# Patient Record
Sex: Male | Born: 1995 | Race: Black or African American | Hispanic: No | Marital: Single | Smoking: Current some day smoker
Health system: Southern US, Community
[De-identification: ages and names within clinical notes are randomized; demographics above are authoritative.]

---
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
## 2018-10-09 ENCOUNTER — Emergency Department (HOSPITAL_COMMUNITY)
Admission: EM | Admit: 2018-10-09 | Discharge: 2018-10-09 | Disposition: A | Discharge status: Home / Self Care | Payer: Self-pay | Attending: Emergency Medicine | Admitting: Emergency Medicine

## 2018-10-09 ENCOUNTER — Encounter (HOSPITAL_COMMUNITY): Payer: Self-pay | Admitting: Emergency Medicine

## 2018-10-09 ENCOUNTER — Emergency Department (HOSPITAL_COMMUNITY): Payer: Self-pay

## 2018-10-09 ENCOUNTER — Other Ambulatory Visit: Payer: Self-pay

## 2018-10-09 DIAGNOSIS — Y929 Unspecified place or not applicable: Secondary | ICD-10-CM | POA: Insufficient documentation

## 2018-10-09 DIAGNOSIS — W2209XA Striking against other stationary object, initial encounter: Secondary | ICD-10-CM | POA: Insufficient documentation

## 2018-10-09 DIAGNOSIS — Y9389 Activity, other specified: Secondary | ICD-10-CM | POA: Insufficient documentation

## 2018-10-09 DIAGNOSIS — Y999 Unspecified external cause status: Secondary | ICD-10-CM | POA: Insufficient documentation

## 2018-10-09 DIAGNOSIS — S6000XA Contusion of unspecified finger without damage to nail, initial encounter: Secondary | ICD-10-CM | POA: Insufficient documentation

## 2018-10-09 NOTE — ED Triage Notes (Signed)
Pt reports punching a table last night. Pt c/o RT hand pain especially towards 3rd and 4th finger. Edema and possible deformity noted.

## 2018-10-09 NOTE — Discharge Instructions (Addendum)
X-ray shows no broken bones.  You will be sore for several days.  Ice.  Elevate.

## 2018-10-09 NOTE — ED Provider Notes (Signed)
[facility] EMERGENCY DEPARTMENT Provider Note   CSN: [id number] Arrival date & time: 10/09/18  0957    History   Chief Complaint Chief Complaint  Patient presents with  . Hand Injury    HPI Nevil Anger is a 23 y.o. male.     Right hand pain after smashing a table last night.  No other injuries.  Severity is moderate.     History reviewed. No pertinent past medical history.  There are no active problems to display for this patient.   History reviewed. No pertinent surgical history.      Home Medications    Prior to Admission medications   Medication Sig Start Date End Date Taking? Authorizing Provider  amoxicillin (AMOXIL) 500 MG capsule Take 1 capsule (500 mg total) by mouth 3 (three) times daily. 10/22/12   Ivery Quale, PA-C  HYDROcodone-acetaminophen (NORCO/VICODIN) 5-325 MG per tablet Take 1 tablet by mouth every 4 (four) hours as needed for pain. 10/22/12   Ivery Quale, PA-C    Family History No family history on file.  Social History Social History   Tobacco Use  . Smoking status: Current Every Day Smoker    Packs/day: 0.50  . Smokeless tobacco: Never Used  Substance Use Topics  . Alcohol use: Yes    Comment: occassional  . Drug use: Not Currently     Allergies   Patient has no known allergies.   Review of Systems Review of Systems  All other systems reviewed and are negative.    Physical Exam Updated Vital Signs BP 139/85   Pulse (!) 52   Temp 98.3 F (36.8 C) (Oral)   Resp 14   Wt 68 kg   SpO2 100%   Physical Exam Vitals signs and nursing note reviewed.  Constitutional:      Appearance: He is well-developed.  HENT:     Head: Normocephalic and atraumatic.  Eyes:     Conjunctiva/sclera: Conjunctivae normal.  Neck:     Musculoskeletal: Neck supple.  Musculoskeletal:     Comments: Right hand: Most tender over the fourth and fifth metacarpal joint.  Pain with flexion.  Minimal swelling.  Skin:    General: Skin is  warm and dry.  Neurological:     Mental Status: He is alert and oriented to person, place, and time.  Psychiatric:        Behavior: Behavior normal.      ED Treatments / Results  Labs (all labs ordered are listed, but only abnormal results are displayed) Labs Reviewed - No data to display  EKG None  Radiology Dg Hand Complete Right  Result Date: 10/09/2018 CLINICAL DATA:  Punching injury last night with fourth and fifth finger pain. EXAM: RIGHT HAND - COMPLETE 3+ VIEW COMPARISON:  None. FINDINGS: There is no evidence of fracture or dislocation. There is no evidence of arthropathy or other focal bone abnormality. Soft tissues are unremarkable. IMPRESSION: Negative. Electronically Signed   By: Paulina Fusi M.D.   On: 10/09/2018 10:48    Procedures Procedures (including critical care time)  Medications Ordered in ED Medications - No data to display   Initial Impression / Assessment and Plan / ED Course  I have reviewed the triage vital signs and the nursing notes.  Pertinent labs & imaging results that were available during my care of the patient were reviewed by me and considered in my medical decision making (see chart for details).        Plain films of right hand  negative.  R ICE.  Final Clinical Impressions(s) / ED Diagnoses   Final diagnoses:  Contusion of finger of right hand, unspecified finger, initial encounter    ED Discharge Orders    None       Donnetta Hutching, MD 10/09/18 1353

## 2018-10-28 ENCOUNTER — Encounter (HOSPITAL_COMMUNITY): Payer: Self-pay

## 2018-10-28 ENCOUNTER — Emergency Department (HOSPITAL_COMMUNITY)
Admission: EM | Admit: 2018-10-28 | Discharge: 2018-10-28 | Disposition: A | Discharge status: Home / Self Care | Payer: BLUE CROSS/BLUE SHIELD | Attending: Emergency Medicine | Admitting: Emergency Medicine

## 2018-10-28 ENCOUNTER — Emergency Department (HOSPITAL_COMMUNITY): Payer: BLUE CROSS/BLUE SHIELD

## 2018-10-28 ENCOUNTER — Other Ambulatory Visit: Payer: Self-pay

## 2018-10-28 DIAGNOSIS — Y9389 Activity, other specified: Secondary | ICD-10-CM | POA: Insufficient documentation

## 2018-10-28 DIAGNOSIS — S9031XA Contusion of right foot, initial encounter: Secondary | ICD-10-CM | POA: Diagnosis not present

## 2018-10-28 DIAGNOSIS — S99921A Unspecified injury of right foot, initial encounter: Secondary | ICD-10-CM | POA: Diagnosis present

## 2018-10-28 DIAGNOSIS — W208XXA Other cause of strike by thrown, projected or falling object, initial encounter: Secondary | ICD-10-CM | POA: Insufficient documentation

## 2018-10-28 DIAGNOSIS — Y92019 Unspecified place in single-family (private) house as the place of occurrence of the external cause: Secondary | ICD-10-CM | POA: Insufficient documentation

## 2018-10-28 DIAGNOSIS — F17210 Nicotine dependence, cigarettes, uncomplicated: Secondary | ICD-10-CM | POA: Insufficient documentation

## 2018-10-28 DIAGNOSIS — Y998 Other external cause status: Secondary | ICD-10-CM | POA: Diagnosis not present

## 2018-10-28 MED ORDER — IBUPROFEN 600 MG PO TABS: 600.0000 mg | ORAL_TABLET | Freq: Four times a day (QID) | ORAL | 0 refills | Status: DC | PRN

## 2018-10-28 MED ORDER — IBUPROFEN 800 MG PO TABS
800.0000 mg | ORAL_TABLET | Freq: Once | ORAL | Status: AC
  Administered 2018-10-28: 800 mg via ORAL
  Filled 2018-10-28: qty 1

## 2018-10-28 NOTE — Discharge Instructions (Addendum)
Your xrays are negative, no broken bones.  Recommend ice and elevation as much as possible over the next several days to help with swelling and pain.  You may continue to wrap your foot which will also help with swelling.  Use the crutches for comfort.  Plan a recheck by Dr. Romeo Apple if your symptoms are not improving over the next 2 weeks.  Expect that your foot will develop significant bruising as you are injury continues to heal.  You have been prescribed ibuprofen to help with pain and swelling.  You can pick this prescription up at your pharmacy.

## 2018-10-28 NOTE — ED Provider Notes (Signed)
[facility] EMERGENCY DEPARTMENT Provider Note   CSN: [id number] Arrival date & time: 10/28/18  1942    History   Chief Complaint Chief Complaint  Patient presents with  . Foot Injury    right, dorsal    HPI Ryan Velez is a 23 y.o. male.     The history is provided by the patient.  Foot Injury  Location:  Foot Foot location:  R foot Pain details:    Quality:  Throbbing and aching   Radiates to:  Does not radiate   Severity:  Moderate   Duration: Injury occured several hours ago.  He was rearranging furniture when a TV fell on the top of his right foot.     Progression:  Worsening (swelling worsening over the past hour) Dislocation: no   Foreign body present:  No foreign bodies Prior injury to area:  No Relieved by:  None tried Worsened by:  Bearing weight Ineffective treatments:  None tried Associated symptoms: decreased ROM and swelling   Associated symptoms: no fever and no tingling     History reviewed. No pertinent past medical history.  There are no active problems to display for this patient.   History reviewed. No pertinent surgical history.      Home Medications    Prior to Admission medications   Medication Sig Start Date End Date Taking? Authorizing Provider  ibuprofen (ADVIL,MOTRIN) 600 MG tablet Take 1 tablet (600 mg total) by mouth every 6 (six) hours as needed. 10/28/18   Burgess Amor, PA-C    Family History No family history on file.  Social History Social History   Tobacco Use  . Smoking status: Current Every Day Smoker    Packs/day: 0.50  . Smokeless tobacco: Never Used  Substance Use Topics  . Alcohol use: Yes    Comment: occassional  . Drug use: Not on file     Allergies   Patient has no known allergies.   Review of Systems Review of Systems  Constitutional: Negative for fever.  Musculoskeletal: Positive for arthralgias and joint swelling. Negative for myalgias.  Neurological: Negative for weakness and numbness.      Physical Exam Updated Vital Signs BP 132/76 (BP Location: Left Arm)   Pulse 87   Temp 98.3 F (36.8 C) (Oral)   Resp 16   Ht 5\' 11"  (1.803 m)   Wt 68 kg   SpO2 98%   BMI 20.92 kg/m   Physical Exam Constitutional:      Appearance: He is well-developed.  HENT:     Head: Atraumatic.  Neck:     Musculoskeletal: Normal range of motion.  Cardiovascular:     Comments: Pulses equal bilaterally Musculoskeletal:        General: Tenderness present.     Right foot: Decreased range of motion. Swelling present. No deformity or laceration.     Comments: Moderate edema to dorsal right foot, not involving toes.  Ankle is nontender.  Dorsalis pedis pulse is appreciated.  He has less than 2-second cap refill in his toes.  He has difficulty flexing the ankle and bending the toe secondary to swelling.  Skin is intact.  Skin:    General: Skin is warm and dry.  Neurological:     Mental Status: He is alert.     Sensory: No sensory deficit.     Deep Tendon Reflexes: Reflexes normal.      ED Treatments / Results  Labs (all labs ordered are listed, but only abnormal results are  displayed) Labs Reviewed - No data to display  EKG None  Radiology Dg Foot Complete Right  Result Date: 10/28/2018 CLINICAL DATA:  Swelling along the dorsal aspect of the foot. Pulses present. Bruising and redness. Recent trauma with a TV falling on the foot. EXAM: RIGHT FOOT COMPLETE - 3+ VIEW COMPARISON:  None. FINDINGS: There is swelling along the top of the foot.  No fractures are seen. IMPRESSION: Soft tissue swelling.  No fracture identified. Electronically Signed   By: Gerome Sam III M.D   On: 10/28/2018 21:13    Procedures Procedures (including critical care time)  Medications Ordered in ED Medications  ibuprofen (ADVIL,MOTRIN) tablet 800 mg (800 mg Oral Given 10/28/18 2106)     Initial Impression / Assessment and Plan / ED Course  I have reviewed the triage vital signs and the nursing notes.   Pertinent labs & imaging results that were available during my care of the patient were reviewed by me and considered in my medical decision making (see chart for details).        Pt with foot contusion secondary to direct blow. No fractures noted,  nv intact.  Watson jones dressing, crutches for comfort.  RICE. Prn f/u with Dr Romeo Apple if sx are not improving over the next 10-14 days.  Final Clinical Impressions(s) / ED Diagnoses   Final diagnoses:  Contusion of right foot, initial encounter    ED Discharge Orders         Ordered    ibuprofen (ADVIL,MOTRIN) 600 MG tablet  Every 6 hours PRN     10/28/18 2119           Victoriano Lain 10/28/18 2120    Terrilee Files, MD 10/29/18 1055

## 2018-10-28 NOTE — ED Triage Notes (Signed)
Pt reports a "TV falling on top of right foot" while attempting to move furniture around. Dorsal aspect of right foot is swollen, pulses present. Bruising, redness present.

## 2018-11-11 ENCOUNTER — Telehealth: Payer: Self-pay | Admitting: Orthopedic Surgery

## 2018-11-11 NOTE — Telephone Encounter (Signed)
Patient was seen in [facility] ER on 10/28/18 for Contusion of right foot. He was told to call our office in two weeks to schedule an appointment with you. I explained how our office is scheduling now with the COVID-19 restrictions and he was ok with waiting for a respond from our office.  Please advise

## 2018-11-16 NOTE — Telephone Encounter (Signed)
Virtual visit next week 

## 2018-11-21 ENCOUNTER — Ambulatory Visit (INDEPENDENT_AMBULATORY_CARE_PROVIDER_SITE_OTHER): Payer: BLUE CROSS/BLUE SHIELD | Admitting: Orthopedic Surgery

## 2018-11-21 ENCOUNTER — Other Ambulatory Visit: Payer: Self-pay

## 2018-11-21 DIAGNOSIS — S9031XA Contusion of right foot, initial encounter: Secondary | ICD-10-CM | POA: Diagnosis not present

## 2018-11-21 NOTE — Patient Instructions (Signed)
Come into the office on Wednesday between 10 and 11 for the nurse to apply a cam walker

## 2018-11-21 NOTE — Progress Notes (Signed)
Virtual Visit via Telephone Note  I connected with Ryan Velez on 11/21/18 at  2:20 PM EDT by telephone and verified that I am speaking with the correct person using two identifiers.   I discussed the limitations, risks, security and privacy concerns of performing an evaluation and management service by telephone and the availability of in person appointments. I also discussed with the patient that there may be a patient responsible charge related to this service. The patient expressed understanding and agreed to proceed.   I discussed the assessment and treatment plan with the patient. The patient was provided an opportunity to ask questions and all were answered. The patient agreed with the plan and demonstrated an understanding of the instructions.   The patient was advised to call back or seek an in-person evaluation if the symptoms worsen or if the condition fails to improve as anticipated.  I provided 5 minutes of non-face-to-face time during this encounter.  Chief Complaint  Patient presents with  . Foot Pain    Pain right foot March 3   23 year old male dropped a television on his foot on April 3.  He went to the ER he was diagnosed with contusion placed in an Ace wrap given crutches and ibuprofen and presents for his follow-up visit virtually  Location of pain is on the dorsum of the right foot quality of pain is dull severity is moderate timing is constant duration since April 3.  The patient says that he can walk on it but cannot stand up for long periods of time he has a large hematoma on the dorsum of the foot  Review of Systems  Musculoskeletal: Positive for joint pain.  Skin:       Hematoma foot/right  Neurological: Negative for tingling.   No past medical history on file. Denies any history of hypertension diabetes heart disease  Physical Exam Virtual visit could not perform  Medical decision making  Data image interpretation: The radiograph shows that the x-ray  was taken of the injured right foot there is no fracture dislocation or arthritis.  This is my personal interpretation of the image  The image report was also reviewed  CLINICAL DATA:  Swelling along the dorsal aspect of the foot. Pulses present. Bruising and redness. Recent trauma with a TV falling on the foot.   EXAM: RIGHT FOOT COMPLETE - 3+ VIEW   COMPARISON:  None.   FINDINGS: There is swelling along the top of the foot.  No fractures are seen.   IMPRESSION: Soft tissue swelling.  No fracture identified.     Electronically Signed   By: Gerome Sam III M.D   On: 10/28/2018 21:13   Diagnosis new problem contusion right foot Encounter Diagnosis  Name Primary?  . Contusion of right foot, initial encounter Yes     Risk section of medical decision making: Over-the-counter medication ibuprofen is fine he can continue that.  I would like to place him in a cam walker and he will come to the office to get that on Wednesday     Fuller Canada, MD

## 2018-11-23 ENCOUNTER — Ambulatory Visit (INDEPENDENT_AMBULATORY_CARE_PROVIDER_SITE_OTHER): Payer: BLUE CROSS/BLUE SHIELD | Admitting: Orthopedic Surgery

## 2018-11-23 ENCOUNTER — Other Ambulatory Visit: Payer: Self-pay

## 2018-11-23 DIAGNOSIS — S9031XD Contusion of right foot, subsequent encounter: Secondary | ICD-10-CM

## 2018-11-23 NOTE — Progress Notes (Signed)
No charges, patient has gotten a cam walker  boot, Thanks

## 2018-12-14 ENCOUNTER — Encounter: Payer: Self-pay | Admitting: Orthopedic Surgery

## 2018-12-14 ENCOUNTER — Ambulatory Visit (INDEPENDENT_AMBULATORY_CARE_PROVIDER_SITE_OTHER): Payer: BLUE CROSS/BLUE SHIELD | Admitting: Orthopedic Surgery

## 2018-12-14 ENCOUNTER — Other Ambulatory Visit: Payer: Self-pay

## 2018-12-14 VITALS — BP 116/75 | HR 53 | Temp 97.2°F | Ht 71.0 in | Wt 155.0 lb

## 2018-12-14 DIAGNOSIS — S9031XD Contusion of right foot, subsequent encounter: Secondary | ICD-10-CM | POA: Diagnosis not present

## 2018-12-14 NOTE — Patient Instructions (Signed)
Resume normal activity with the boot off

## 2018-12-14 NOTE — Progress Notes (Signed)
Progress Note   Patient ID: Ryan Velez, male   DOB: [birth year], 23 y.o.   MRN: [id number]   Chief Complaint  Patient presents with  . Foot Pain    improving right foot injury April 3rd      22 MALE television fell on his right foot he was placed in a cam walker to improve his gait.  The foot is improved this not has gone down to where it was on the top of his foot    Review of Systems  Skin: Negative.   Neurological: Negative for tingling.     No Known Allergies   BP 116/75   Pulse (!) 53   Temp (!) 97.2 F (36.2 C)   Ht 5\' 11"  (1.803 m)   Wt 155 lb (70.3 kg)   BMI 21.62 kg/m   Physical Exam Vitals signs and nursing note reviewed.  Constitutional:      Appearance: Normal appearance.  Musculoskeletal:     Right foot: Normal range of motion and normal capillary refill. No tenderness, bony tenderness, swelling or deformity.  Neurological:     Mental Status: He is alert and oriented to person, place, and time.  Psychiatric:        Mood and Affect: Mood normal.      Medical decisions:   Data  Imaging:   The image showed no fracture  Encounter Diagnosis  Name Primary?  . Contusion of right foot, subsequent encounter Yes    PLAN:   Remove the boot, return to normal activity    Fuller Canada, MD 12/14/2018 9:29 AM

## 2019-06-03 IMAGING — CR RIGHT FOOT COMPLETE - 3+ VIEW
3 series · 3 of 3 positions shown · non-contrast
Comparison: None.

CLINICAL DATA: Swelling along the dorsal aspect of the foot. Pulses
present. Bruising and redness. Recent trauma with a TV falling on
the foot.

EXAM:
RIGHT FOOT COMPLETE - 3+ VIEW

[ap]
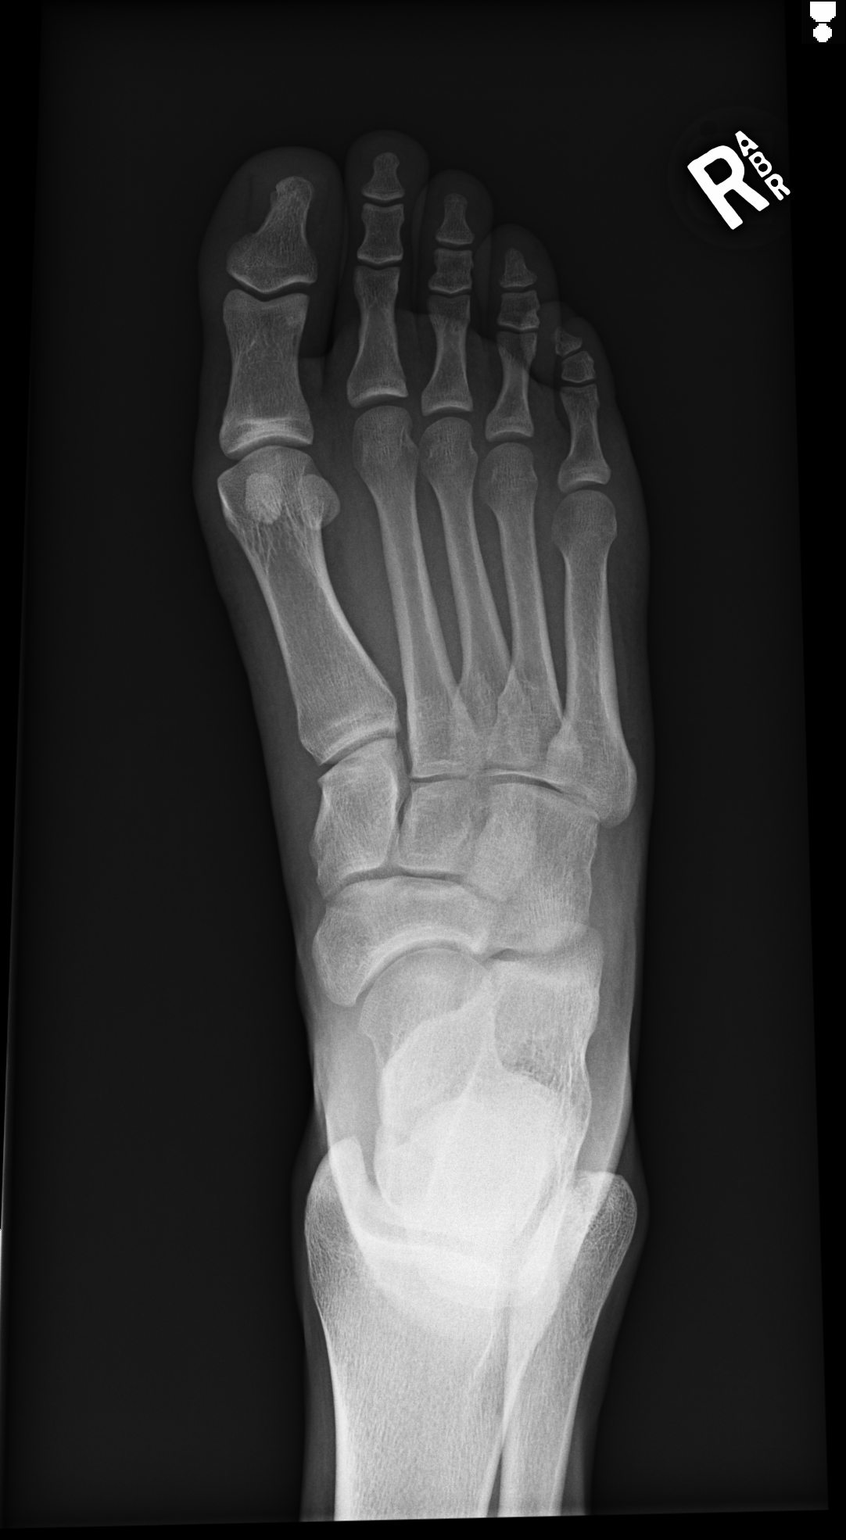

[oblique]
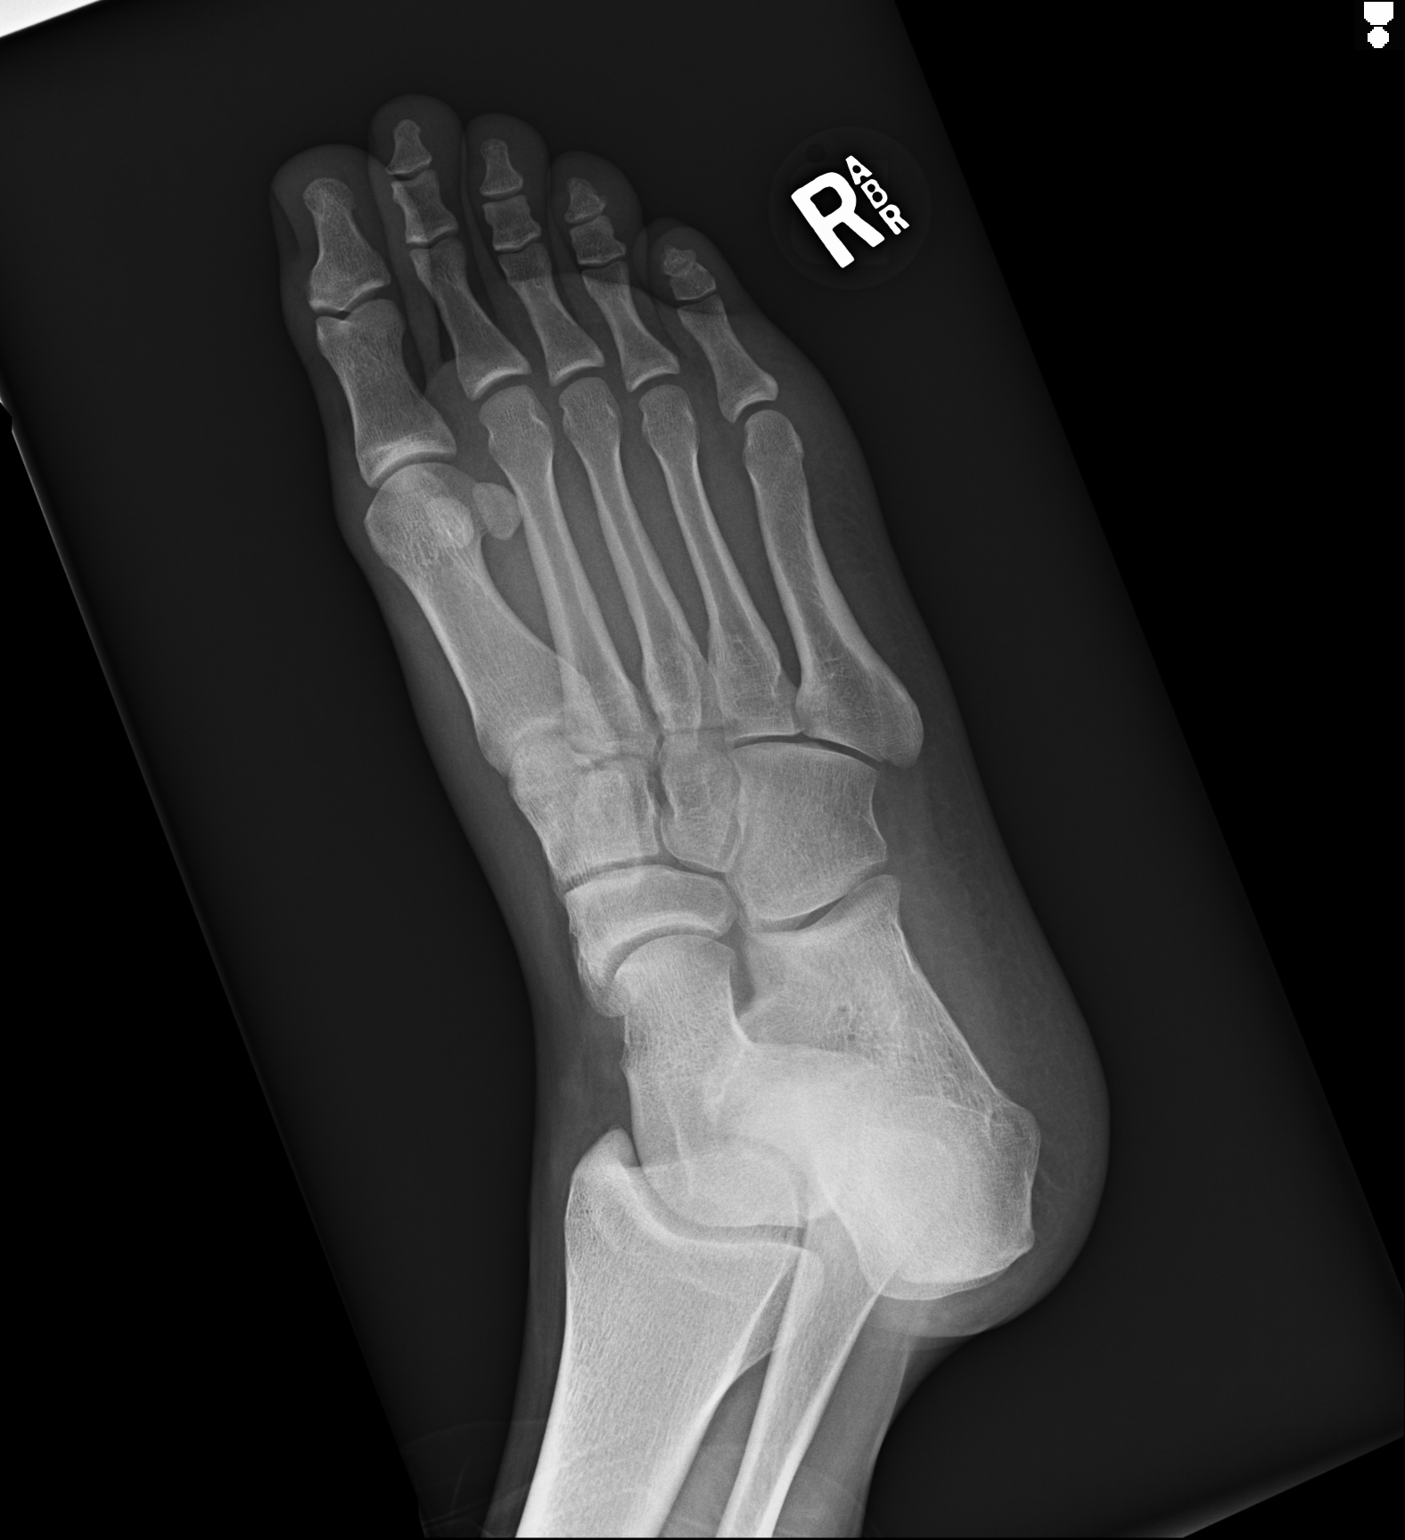

[lat]
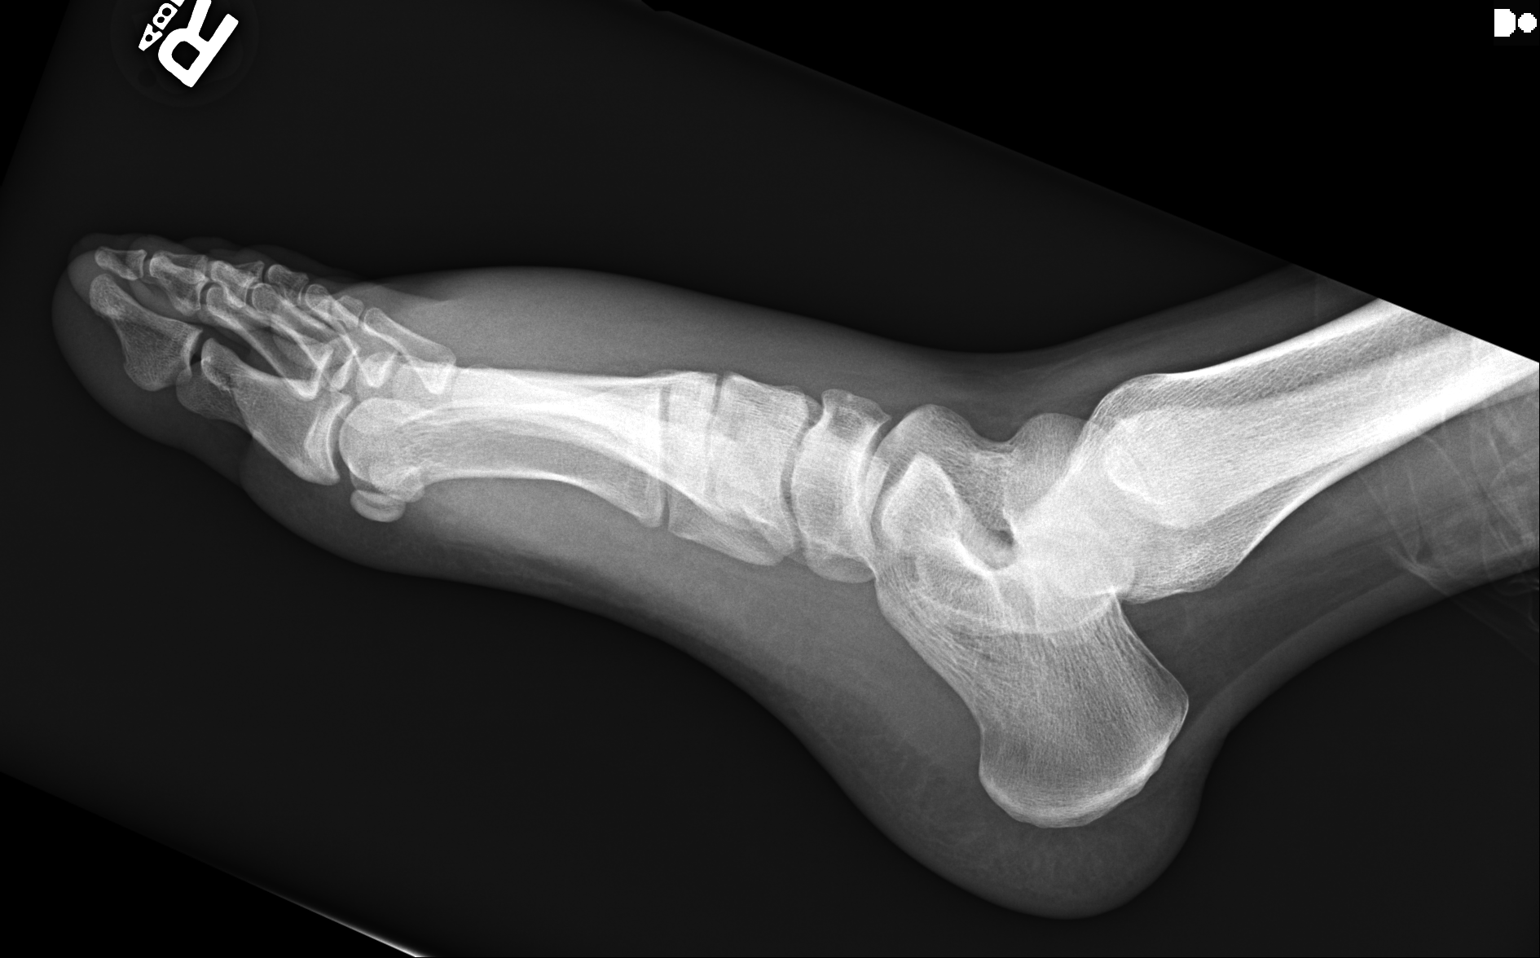

[3 of 3 positions shown; findings below may reference images not displayed]

FINDINGS: There is swelling along the top of the foot.  No fractures are seen.
IMPRESSION: Soft tissue swelling.  No fracture identified.

## 2020-12-03 ENCOUNTER — Encounter (HOSPITAL_COMMUNITY): Payer: Self-pay | Admitting: *Deleted

## 2020-12-03 ENCOUNTER — Other Ambulatory Visit: Payer: Self-pay

## 2020-12-03 ENCOUNTER — Emergency Department (HOSPITAL_COMMUNITY)
Admission: EM | Admit: 2020-12-03 | Discharge: 2020-12-03 | Disposition: A | Discharge status: Home / Self Care | Payer: Self-pay | Attending: Emergency Medicine | Admitting: Emergency Medicine

## 2020-12-03 ENCOUNTER — Emergency Department (HOSPITAL_COMMUNITY): Payer: Self-pay

## 2020-12-03 DIAGNOSIS — W228XXA Striking against or struck by other objects, initial encounter: Secondary | ICD-10-CM | POA: Insufficient documentation

## 2020-12-03 DIAGNOSIS — F17210 Nicotine dependence, cigarettes, uncomplicated: Secondary | ICD-10-CM | POA: Insufficient documentation

## 2020-12-03 DIAGNOSIS — M79642 Pain in left hand: Secondary | ICD-10-CM | POA: Insufficient documentation

## 2020-12-03 DIAGNOSIS — Y92009 Unspecified place in unspecified non-institutional (private) residence as the place of occurrence of the external cause: Secondary | ICD-10-CM | POA: Insufficient documentation

## 2020-12-03 DIAGNOSIS — Y9389 Activity, other specified: Secondary | ICD-10-CM | POA: Insufficient documentation

## 2020-12-03 MED ORDER — KETOROLAC TROMETHAMINE 60 MG/2ML IM SOLN
30.0000 mg | Freq: Once | INTRAMUSCULAR | Status: AC
  Administered 2020-12-03: 30 mg via INTRAMUSCULAR
  Filled 2020-12-03: qty 2

## 2020-12-03 NOTE — ED Triage Notes (Signed)
Pain in left hand after working outside yesterday

## 2020-12-03 NOTE — ED Provider Notes (Signed)
[facility] EMERGENCY DEPARTMENT Provider Note   CSN: [id number] Arrival date & time: 12/03/20  1308     History No chief complaint on file.   Ryan Velez is a 25 y.o. male.  HPI   Patient with no significant medical history presents to the emergency department with chief complaint of left hand pain.  Patient endorses that this started yesterday, states he was working on his grandmother's house, doing some manual labor, he thinks while he was moving a box his hand hit something and since then he has been having pain.  He states the pain is just on the anterior aspect of his hand along his second metacarpal and second digit, states he has pain when he tries to squeeze something or lift something with his hand, he denies paresthesia or weakness in the hand, he denies systemic infection like fevers or chills, denies IV drug use, denies autoimmune diseases, has not had anything for pain at this time.  He denies leaving factors.  Patient denies headaches, fevers, chills, shortness of breath, chest pain, abdominal pain, nausea, vomiting diarrhea.  History reviewed. No pertinent past medical history.  There are no problems to display for this patient.   History reviewed. No pertinent surgical history.     Family History  Problem Relation Age of Onset  . Healthy Mother   . Healthy Father     Social History   Tobacco Use  . Smoking status: Current Every Day Smoker    Packs/day: 0.50  . Smokeless tobacco: Never Used  Vaping Use  . Vaping Use: Never used  Substance Use Topics  . Alcohol use: Yes    Comment: occassional    Home Medications Prior to Admission medications   Medication Sig Start Date End Date Taking? Authorizing Provider  ibuprofen (ADVIL,MOTRIN) 600 MG tablet Take 1 tablet (600 mg total) by mouth every 6 (six) hours as needed. 10/28/18   Burgess Amor, PA-C    Allergies    Patient has no known allergies.  Review of Systems   Review of Systems   Constitutional: Negative for chills and fever.  HENT: Negative for congestion.   Respiratory: Negative for shortness of breath.   Cardiovascular: Negative for chest pain.  Gastrointestinal: Negative for abdominal pain.  Genitourinary: Negative for enuresis.  Musculoskeletal: Negative for back pain.       Right hand pain.  Skin: Negative for rash.  Neurological: Negative for dizziness.  Hematological: Does not bruise/bleed easily.    Physical Exam Updated Vital Signs BP 118/77   Pulse (!) 48   Temp 98.9 F (37.2 C) (Oral)   Resp 16   Ht 5\' 8"  (1.727 m)   Wt 76.7 kg   SpO2 98%   BMI 25.70 kg/m   Physical Exam Vitals and nursing note reviewed.  Constitutional:      General: He is not in acute distress.    Appearance: He is not ill-appearing.  HENT:     Head: Normocephalic and atraumatic.     Nose: No congestion.  Eyes:     Conjunctiva/sclera: Conjunctivae normal.  Cardiovascular:     Rate and Rhythm: Normal rate and regular rhythm.     Pulses: Normal pulses.     Heart sounds: No murmur heard. No friction rub. No gallop.   Pulmonary:     Effort: Pulmonary effort is normal.  Musculoskeletal:        General: Tenderness present.     Comments: Left hand was visualized, there is no noted  edema or erythema, no gross deformities present.  He had full range of motion at his fingers wrist and elbow.  He was slightly tender to palpation along his second metacarpal as well as his second digit, there is no deformities present, no neurovascular fully intact  Skin:    General: Skin is warm and dry.  Neurological:     Mental Status: He is alert.  Psychiatric:        Mood and Affect: Mood normal.     ED Results / Procedures / Treatments   Labs (all labs ordered are listed, but only abnormal results are displayed) Labs Reviewed - No data to display  EKG None  Radiology DG Hand Complete Left  Result Date: 12/03/2020 CLINICAL DATA:  Pt c/o left hand pain after working  outside yesterday. Pt denies injury. Pt says pain is mainly in digits 1-3 and the top of hand. No previous surgery to left hand. EXAM: LEFT HAND - COMPLETE 3+ VIEW COMPARISON:  None. FINDINGS: There is no evidence of fracture or dislocation. There is no evidence of arthropathy or other focal bone abnormality. Soft tissues are unremarkable. IMPRESSION: Negative. Electronically Signed   By: Emmaline Kluver M.D.   On: 12/03/2020 14:45    Procedures Procedures   Medications Ordered in ED Medications  ketorolac (TORADOL) injection 30 mg (has no administration in time range)    ED Course  I have reviewed the triage vital signs and the nursing notes.  Pertinent labs & imaging results that were available during my care of the patient were reviewed by me and considered in my medical decision making (see chart for details).    MDM Rules/Calculators/A&P                         Initial impression-patient presents with left hand pain.  He is alert, does not appear in acute distress, vital signs reassuring.  Will obtain imaging for further evaluation.  Work-up-x-ray negative for acute findings.  Rule out- I have low suspicion for septic arthritis as patient denies IV drug use, skin exam was performed no erythematous, edematous, warm joints noted on exam, no new heart murmur heard on exam.  Low suspicion for fracture or dislocation as x-ray does not feel any significant findings. low suspicion for ligament or tendon damage as area was palpated no gross defects noted, he had full range of motion at all joints in his fingers wrist and elbow..  Low suspicion for compartment syndrome as area was palpated it was soft to the touch, neurovascular fully intact.   Plan-  1.  Hand pain-suspect secondary due to a muscular strain, possibly he might suffered damage to his ligament or tendons we will have him follow-up with hand surgery in 1 week time symptoms do not improve.  Vital signs have remained stable, no  indication for hospital admission.   Patient given at home care as well strict return precautions.  Patient verbalized that they understood agreed to said plan.   Final Clinical Impression(s) / ED Diagnoses Final diagnoses:  None    Rx / DC Orders ED Discharge Orders    None       Carroll Sage, PA-C 12/03/20 1520    Jacalyn Lefevre, MD 12/04/20 [phone]

## 2020-12-03 NOTE — Discharge Instructions (Addendum)
You have been seen here for left hand pain I recommend taking over-the-counter pain medications like ibuprofen and/or Tylenol every 6 as needed.  Please follow dosage and on the back of bottle.  I also recommend applying heat to the area and stretching out the muscles as this will help decrease stiffness and pain.  I have given you information on exercises please follow.  If your symptoms do not improve after a week's time would like to follow-up with hand surgery for further evaluation.  Come back to the emergency department if you develop chest pain, shortness of breath, severe abdominal pain, uncontrolled nausea, vomiting, diarrhea.

## 2021-07-09 IMAGING — DX DG HAND COMPLETE 3+V*L*
3 series · 3 of 3 positions shown · non-contrast
Comparison: None.

CLINICAL DATA: Pt c/o left hand pain after working outside
yesterday. Pt denies injury. Pt says pain is mainly in digits 1-3
and the top of hand. No previous surgery to left hand.

EXAM:
LEFT HAND - COMPLETE 3+ VIEW

[hand pa]
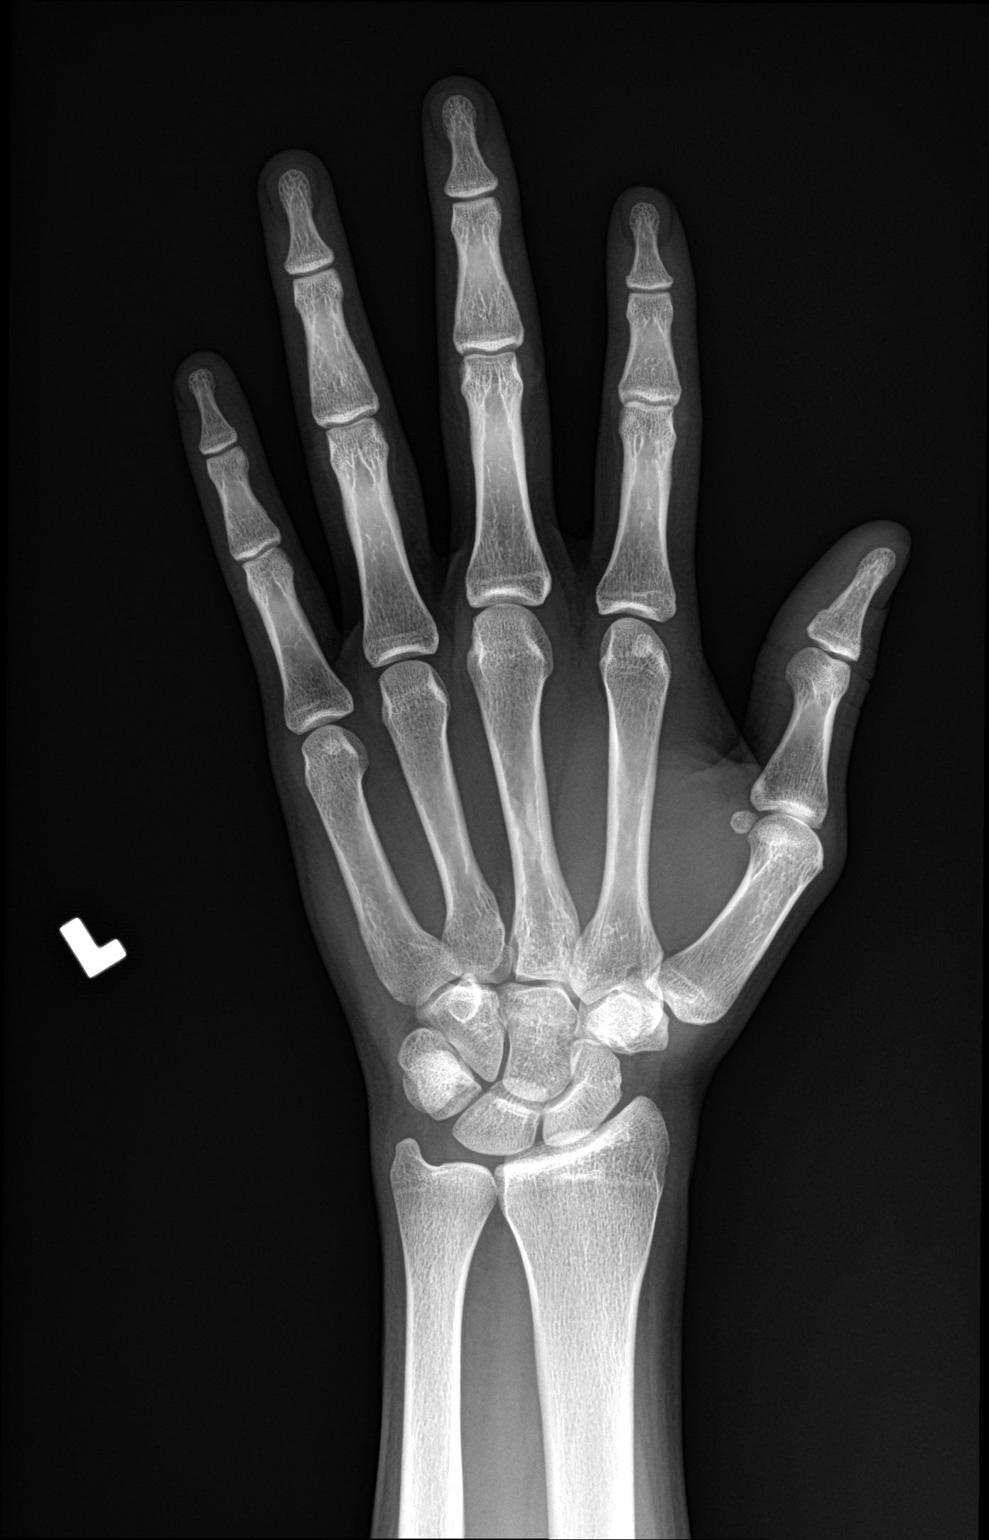

[hand obl]
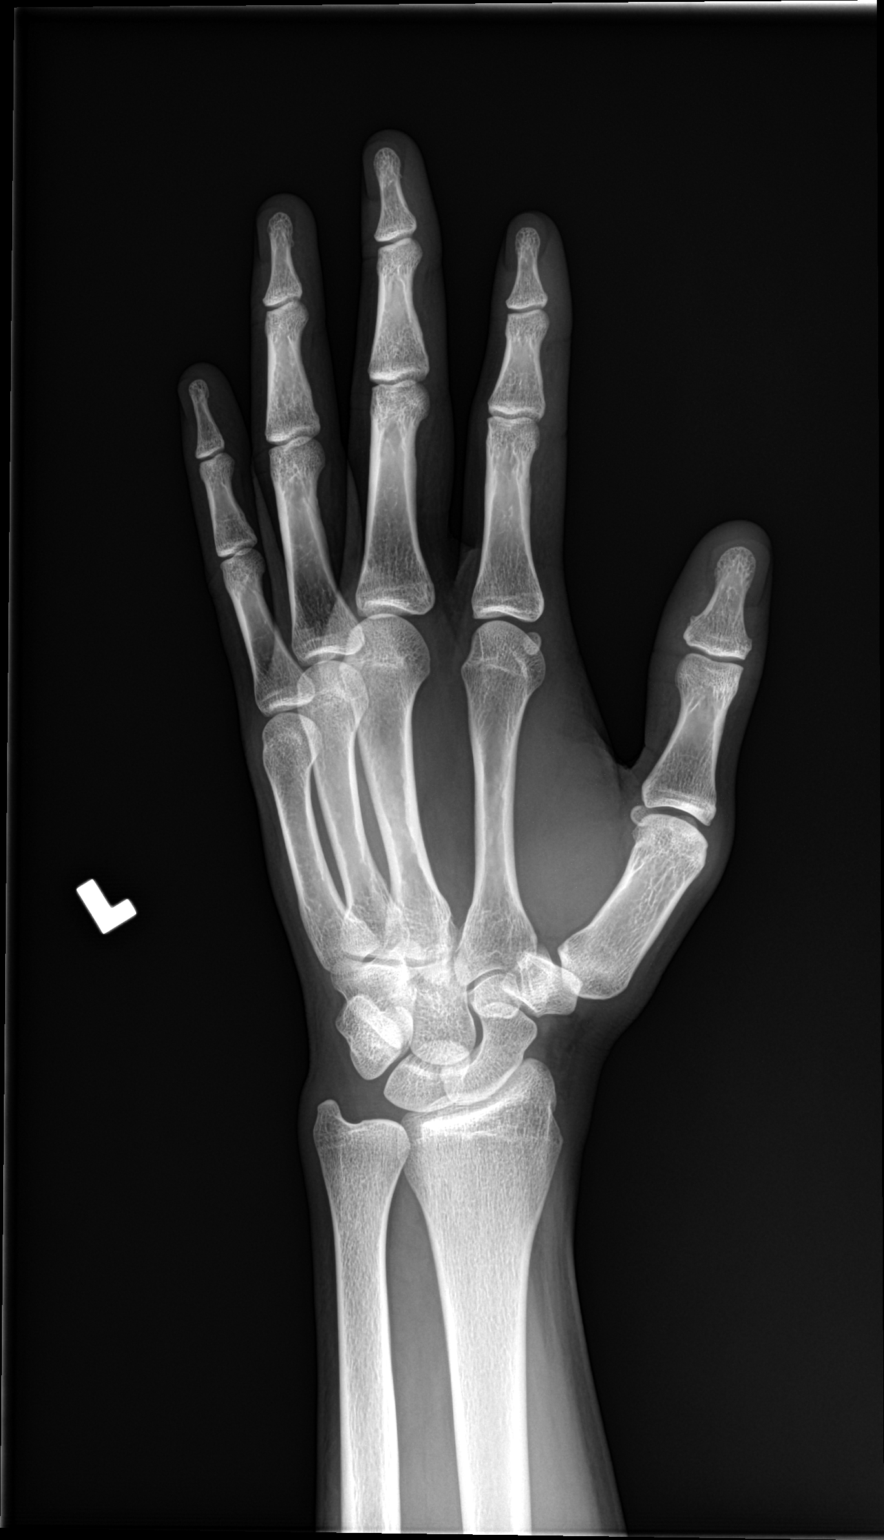

[hand lat]
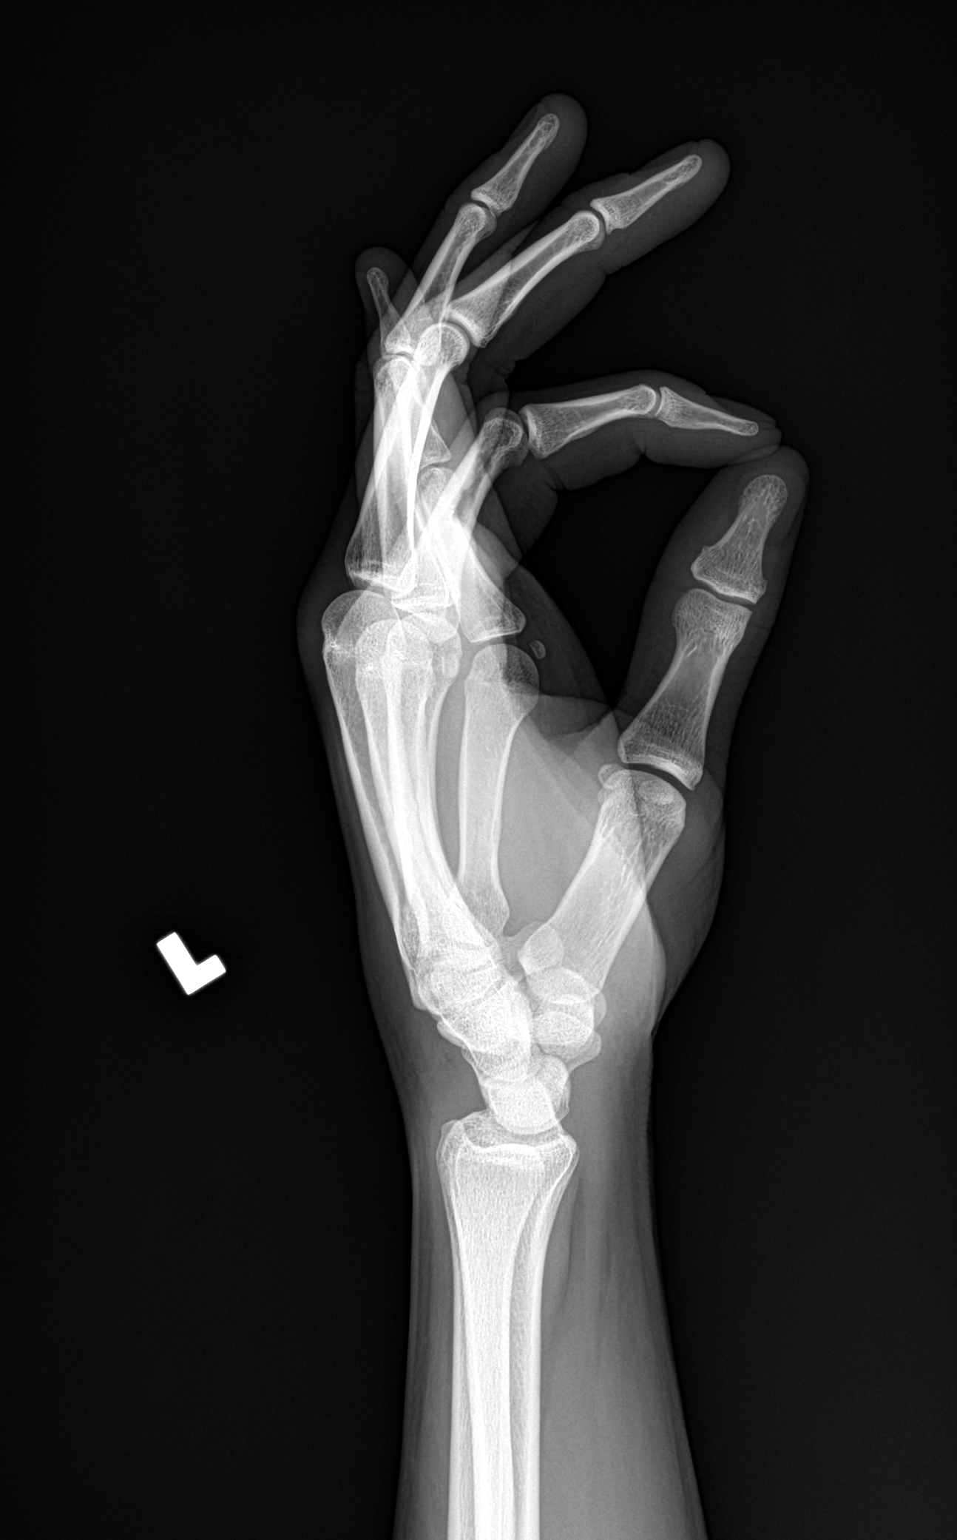

[3 of 3 positions shown; findings below may reference images not displayed]

FINDINGS: There is no evidence of fracture or dislocation. There is no
evidence of arthropathy or other focal bone abnormality. Soft
tissues are unremarkable.
IMPRESSION: Negative.

## 2022-10-15 ENCOUNTER — Emergency Department (HOSPITAL_COMMUNITY)
Admission: EM | Admit: 2022-10-15 | Discharge: 2022-10-15 | Disposition: A | Discharge status: Home / Self Care | Payer: Self-pay | Attending: Emergency Medicine | Admitting: Emergency Medicine

## 2022-10-15 ENCOUNTER — Encounter (HOSPITAL_COMMUNITY): Payer: Self-pay

## 2022-10-15 ENCOUNTER — Emergency Department (HOSPITAL_COMMUNITY): Payer: Self-pay

## 2022-10-15 ENCOUNTER — Other Ambulatory Visit: Payer: Self-pay

## 2022-10-15 DIAGNOSIS — M7989 Other specified soft tissue disorders: Secondary | ICD-10-CM | POA: Insufficient documentation

## 2022-10-15 DIAGNOSIS — W231XXA Caught, crushed, jammed, or pinched between stationary objects, initial encounter: Secondary | ICD-10-CM | POA: Insufficient documentation

## 2022-10-15 DIAGNOSIS — S6992XA Unspecified injury of left wrist, hand and finger(s), initial encounter: Secondary | ICD-10-CM

## 2022-10-15 DIAGNOSIS — S61422A Laceration with foreign body of left hand, initial encounter: Secondary | ICD-10-CM | POA: Insufficient documentation

## 2022-10-15 DIAGNOSIS — Z23 Encounter for immunization: Secondary | ICD-10-CM | POA: Insufficient documentation

## 2022-10-15 MED ORDER — TETANUS-DIPHTH-ACELL PERTUSSIS 5-2.5-18.5 LF-MCG/0.5 IM SUSY
0.5000 mL | PREFILLED_SYRINGE | Freq: Once | INTRAMUSCULAR | Status: AC
  Administered 2022-10-15: 0.5 mL via INTRAMUSCULAR
  Filled 2022-10-15: qty 0.5

## 2022-10-15 NOTE — Discharge Instructions (Addendum)
You were seen in the emergency department for hand injury.  Your x-rays did not show any broken or dislocated bones.   We were able to close your laceration with skin glue. The adhesive should peel off in about 5 to 10 days.  If it comes off sooner that is okay.  If it lasts longer than that, you can use some Vaseline to help it come off on its own.  With the glue, you may shower, but do not soak or scrub the area for 7 to 10 days.  Then make sure that you pat the area dry.   If you want to wear a bandage over the area that is fine as well, but make sure it is clean/dry (has no ointment on it).  Watch out for signs of infection like we discussed, including: increased redness, tenderness, or drainage of pus from the site. If this happens and you were not prescribed antibiotics, please seek medical attention.   You can take over the counter pain medicine like ibuprofen or tylenol as needed.

## 2022-10-15 NOTE — ED Triage Notes (Signed)
Pt reports he got angry and punched a large candle in a glass container. Left middle and ring finger lacerations and pain to hand and wrist.

## 2022-10-15 NOTE — ED Provider Notes (Signed)
[facility] Provider Note   CSN: [id number] Arrival date & time: 10/15/22  1722     History  Chief Complaint  Patient presents with   Hand Injury    Ryan Velez is a 27 y.o. male with no significant past medical history who presents the emergency department complaining of lacerations to the left hand.  Patient states that he got angry and slammed his hand down on a candle that was in a glass container.  Sustained lacerations to the hands, and complaining of pain to the hand and wrist.  Unknown last tetanus.   Hand Injury      Home Medications Prior to Admission medications   Medication Sig Start Date End Date Taking? Authorizing Provider  ibuprofen (ADVIL,MOTRIN) 600 MG tablet Take 1 tablet (600 mg total) by mouth every 6 (six) hours as needed. 10/28/18   Evalee Jefferson, PA-C      Allergies    Patient has no known allergies.    Review of Systems   Review of Systems  Musculoskeletal:  Positive for arthralgias.  Skin:  Positive for wound.  All other systems reviewed and are negative.   Physical Exam Updated Vital Signs BP (!) 140/90 (BP Location: Right Arm)   Pulse (!) 52   Temp 98.5 F (36.9 C) (Temporal)   Resp 16   Ht 5\' 7"  (1.702 m)   Wt 72.6 kg   BMI 25.06 kg/m  Physical Exam Vitals and nursing note reviewed.  Constitutional:      Appearance: Normal appearance.  HENT:     Head: Normocephalic and atraumatic.  Eyes:     Conjunctiva/sclera: Conjunctivae normal.  Pulmonary:     Effort: Pulmonary effort is normal. No respiratory distress.  Musculoskeletal:     Comments: Normal passive ROM of the digits of the left hand, pain to resistance of the digits and the left wrist  Skin:    General: Skin is warm and dry.     Capillary Refill: Capillary refill takes less than 2 seconds.     Comments: Superficial lacerations noted to the dorsal left third and fourth digits  Neurological:     Mental Status: He is alert.   Psychiatric:        Mood and Affect: Mood normal.        Behavior: Behavior normal.     ED Results / Procedures / Treatments   Labs (all labs ordered are listed, but only abnormal results are displayed) Labs Reviewed - No data to display  EKG None  Radiology DG Hand Complete Left  Result Date: 10/15/2022 CLINICAL DATA:  Status post trauma. EXAM: LEFT HAND - COMPLETE 3+ VIEW COMPARISON:  None Available. FINDINGS: There is no evidence of fracture or dislocation. There is no evidence of arthropathy or other focal bone abnormality. There is mild soft tissue swelling along the lateral aspect of the left hand. Mild soft tissue swelling is also seen along the PIP joint of the third left finger. An adjacent 2 mm linear radiopaque soft tissue foreign body is noted. IMPRESSION: 1. Mild soft tissue swelling without evidence of acute fracture or dislocation. 2. 2 mm linear radiopaque soft tissue foreign body involving the third left finger, as described above. Electronically Signed   By: Virgina Norfolk M.D.   On: 10/15/2022 18:45   DG Wrist Complete Left  Result Date: 10/15/2022 CLINICAL DATA:  Status post trauma. EXAM: LEFT WRIST - COMPLETE 3+ VIEW COMPARISON:  None Available. FINDINGS: There  is no evidence of fracture or dislocation. There is no evidence of arthropathy or other focal bone abnormality. Mild soft tissue swelling is seen along the PIP joint of the third left finger. An associated 2 mm linear radiopaque soft tissue foreign body is noted. IMPRESSION: Soft tissue swelling along the PIP joint of the third left finger with an associated 2 mm linear soft tissue foreign body. Electronically Signed   By: Virgina Norfolk M.D.   On: 10/15/2022 18:43    Procedures .Foreign Body Removal  Date/Time: 10/16/2022 12:00 AM  Performed by: Kateri Plummer, PA-C Authorized by: Kateri Plummer, PA-C  Consent: Verbal consent obtained. Risks and benefits: risks, benefits and alternatives were  discussed Consent given by: patient Patient identity confirmed: provided demographic data Body area: skin General location: upper extremity Location details: left long finger Localization method: visualized Removal mechanism: forceps Tendon involvement: none Depth: subcutaneous Complexity: simple 1 objects recovered. Objects recovered: glass Post-procedure assessment: foreign body removed      Medications Ordered in ED Medications  Tdap (BOOSTRIX) injection 0.5 mL (0.5 mLs Intramuscular Given 10/15/22 2247)    ED Course/ Medical Decision Making/ A&P                             Medical Decision Making Amount and/or Complexity of Data Reviewed Radiology: ordered.  Risk Prescription drug management.   Patient is a 27 y.o. male who presents to the emergency department with concern for lacerations to left hand after injury. Wound occurred <8 hrs prior to ER arrival.   Physical exam: Superficial lacerations noted to the dorsum of the left third and fourth digits.  Maintains normal range of motion of the digits.  Good capillary refill.  Imaging: X-rays ordered from triage including left wrist and left hand with soft tissue swelling, and associated 2 mm linear soft tissue foreign body  Procedure: Wound explored and base of wound visualized in a bloodless field without evidence of foreign body.  Lacerations were cleaned with chlorhexidine and saline, closed with Dermabond.  Small glass foreign body removed.  Patient tolerated procedure well with no immediate complications. Tdap was updated.   Disposition: Patient has  no comorbidities to effect normal wound healing. Patient discharged  without antibiotics.  Discussed suture home care with patient and answered questions. Patient to follow-up for wound check; they are to return to the ED sooner for signs of infection. Pt is hemodynamically stable with no complaints prior to discharge.  Final Clinical Impression(s) / ED  Diagnoses Final diagnoses:  Injury of left hand, initial encounter  Injury of left wrist, initial encounter  Laceration of left hand with foreign body, initial encounter    Rx / DC Orders ED Discharge Orders     None      Portions of this report may have been transcribed using voice recognition software. Every effort was made to ensure accuracy; however, inadvertent computerized transcription errors may be present.    Estill Cotta 10/16/22 0001    Hayden Rasmussen, MD 10/16/22 1525

## 2024-02-15 ENCOUNTER — Emergency Department (HOSPITAL_COMMUNITY)
Admission: EM | Admit: 2024-02-15 | Discharge: 2024-02-15 | Disposition: A | Discharge status: Home / Self Care | Payer: Self-pay | Attending: Emergency Medicine | Admitting: Emergency Medicine

## 2024-02-15 ENCOUNTER — Encounter (HOSPITAL_COMMUNITY): Payer: Self-pay | Admitting: *Deleted

## 2024-02-15 ENCOUNTER — Other Ambulatory Visit: Payer: Self-pay

## 2024-02-15 DIAGNOSIS — H9011 Conductive hearing loss, unilateral, right ear, with unrestricted hearing on the contralateral side: Secondary | ICD-10-CM | POA: Insufficient documentation

## 2024-02-15 DIAGNOSIS — H60331 Swimmer's ear, right ear: Secondary | ICD-10-CM | POA: Insufficient documentation

## 2024-02-15 MED ORDER — CIPROFLOXACIN-DEXAMETHASONE 0.3-0.1 % OT SUSP
4.0000 [drp] | Freq: Two times a day (BID) | OTIC | Status: DC
  Administered 2024-02-15: 4 [drp] via OTIC
  Filled 2024-02-15: qty 7.5

## 2024-02-15 MED ORDER — ACETAMINOPHEN ER 650 MG PO TBCR: 650.0000 mg | EXTENDED_RELEASE_TABLET | Freq: Three times a day (TID) | ORAL | 0 refills | Status: AC | PRN

## 2024-02-15 MED ORDER — AMOXICILLIN 500 MG PO CAPS: 500.0000 mg | ORAL_CAPSULE | Freq: Three times a day (TID) | ORAL | 0 refills | Status: AC

## 2024-02-15 MED ORDER — IBUPROFEN 600 MG PO TABS: 600.0000 mg | ORAL_TABLET | Freq: Four times a day (QID) | ORAL | 0 refills | Status: AC | PRN

## 2024-02-15 MED ORDER — OXYCODONE-ACETAMINOPHEN 5-325 MG PO TABS
1.0000 | ORAL_TABLET | Freq: Once | ORAL | Status: AC
  Administered 2024-02-15: 1 via ORAL
  Filled 2024-02-15: qty 1

## 2024-02-15 MED ORDER — OXYCODONE-ACETAMINOPHEN 5-325 MG PO TABS: 1.0000 | ORAL_TABLET | Freq: Three times a day (TID) | ORAL | 0 refills | Status: AC | PRN

## 2024-02-15 MED ORDER — AMOXICILLIN 250 MG PO CAPS
500.0000 mg | ORAL_CAPSULE | Freq: Once | ORAL | Status: AC
  Administered 2024-02-15: 500 mg via ORAL
  Filled 2024-02-15: qty 2

## 2024-02-15 MED ORDER — CIPROFLOXACIN-DEXAMETHASONE 0.3-0.1 % OT SUSP: 4.0000 [drp] | Freq: Two times a day (BID) | OTIC | 0 refills | Status: AC

## 2024-02-15 NOTE — ED Provider Notes (Signed)
 [facility] EMERGENCY DEPARTMENT AT [facility] Provider Note   CSN: [id number] Arrival date & time: 02/15/24  1145     Patient presents with: Otalgia   Ryan Velez is a 28 y.o. male.   HPI    Patient comes in with chief complaint of right-sided ear pain.  The pain started 2 weeks ago.  Last week, he started noticing drainage from his right ear and today he appreciated loss of hearing.  Patient states that he did go swimming prior to the onset of the symptoms.  Prior to Admission medications   Medication Sig Start Date End Date Taking? Authorizing Provider  acetaminophen  (TYLENOL  8 HOUR) 650 MG CR tablet Take 1 tablet (650 mg total) by mouth every 8 (eight) hours as needed for pain or fever. 02/15/24  Yes Charlyn Sora, MD  amoxicillin  (AMOXIL ) 500 MG capsule Take 1 capsule (500 mg total) by mouth 3 (three) times daily. 02/15/24  Yes Charlyn Sora, MD  ciprofloxacin -dexamethasone  (CIPRODEX ) OTIC suspension Place 4 drops into the right ear 2 (two) times daily. 02/15/24  Yes Charlyn Sora, MD  ibuprofen  (ADVIL ) 600 MG tablet Take 1 tablet (600 mg total) by mouth every 6 (six) hours as needed. 02/15/24  Yes Charlyn Sora, MD  oxyCODONE -acetaminophen  (PERCOCET/ROXICET) 5-325 MG tablet Take 1 tablet by mouth every 8 (eight) hours as needed for severe pain (pain score 7-10). 02/15/24  Yes Charlyn Sora, MD    Allergies: Patient has no known allergies.    Review of Systems  All other systems reviewed and are negative.   Updated Vital Signs BP 137/79 (BP Location: Right Arm)   Pulse 69   Temp 98.3 F (36.8 C) (Oral)   Resp 16   Ht 5' 7 (1.702 m)   Wt 77.1 kg   SpO2 100%   BMI 26.63 kg/m   Physical Exam Vitals and nursing note reviewed.  Constitutional:      Appearance: He is well-developed.  HENT:     Head: Atraumatic.     Ears:     Comments: I am unable to clearly visualize TM on the right side.  There appears to be significant edema and erythema of  the external canal.  There appears to be exudative findings where TM would be.   Bedside evaluation reveals what appears to be conductive hearing loss on the right side Cardiovascular:     Rate and Rhythm: Normal rate.  Pulmonary:     Effort: Pulmonary effort is normal.  Musculoskeletal:     Cervical back: Neck supple.  Skin:    General: Skin is warm.  Neurological:     Mental Status: He is alert and oriented to person, place, and time.     (all labs ordered are listed, but only abnormal results are displayed) Labs Reviewed - No data to display  EKG: None  Radiology: No results found.   Procedures   Medications Ordered in the ED  oxyCODONE -acetaminophen  (PERCOCET/ROXICET) 5-325 MG per tablet 1 tablet (has no administration in time range)  amoxicillin  (AMOXIL ) capsule 500 mg (has no administration in time range)  ciprofloxacin -dexamethasone  (CIPRODEX ) 0.3-0.1 % OTIC (EAR) suspension 4 drop (has no administration in time range)                                    Medical Decision Making Risk OTC drugs. Prescription drug management.   28 year old male comes in with chief complaint of  right-sided ear pain and drainage.  He also now has hearing loss. Patient does indicate that he had gone on swimming before the symptoms started.  Differential diagnosis includes otitis externa, malignant otitis externa, AOM.  No evidence of cellulitis.  No mastoid tenderness.  Patient does not have any systemic symptoms that are concerning and is nontoxic-appearing.  He will put an ear wick in his ear and put him on Ciprodex .  Will also give him oral medicine since I was unable to visualize his TM and patient has hearing loss now.  We have given him ENT follow-up information and patient will call them today for a follow-up appointment.  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

## 2024-02-15 NOTE — ED Triage Notes (Signed)
 Pt with right ear pain x 2 weeks, pain getting worse.  Denies fevers, sorethroat at times. + drainage

## 2024-02-15 NOTE — Discharge Instructions (Addendum)
 You were seen in the emergency room for ear pain. It appears to us  that you have otitis externa.  Treatment of this is cannot be placement of ear wick and then antibiotic ointment as prescribed.  Call the ENT doctor at the number provided to set up an appointment in 3 to 5 days given the hearing loss you have.
